# Patient Record
Sex: Male | Born: 1970 | Race: White | Hispanic: No | Smoking: Former smoker
Health system: Southern US, Community
[De-identification: ages and names within clinical notes are randomized; demographics above are authoritative.]

---

## 2013-08-11 ENCOUNTER — Emergency Department (HOSPITAL_COMMUNITY): Payer: Self-pay

## 2013-08-11 ENCOUNTER — Emergency Department (HOSPITAL_COMMUNITY)
Admission: EM | Admit: 2013-08-11 | Discharge: 2013-08-12 | Disposition: A | Discharge status: Home / Self Care | Payer: Self-pay | Attending: Emergency Medicine | Admitting: Emergency Medicine

## 2013-08-11 DIAGNOSIS — R05 Cough: Secondary | ICD-10-CM

## 2013-08-11 DIAGNOSIS — R059 Cough, unspecified: Secondary | ICD-10-CM | POA: Insufficient documentation

## 2013-08-11 DIAGNOSIS — J45901 Unspecified asthma with (acute) exacerbation: Secondary | ICD-10-CM | POA: Insufficient documentation

## 2013-08-11 LAB — CBC
HCT: 45.3 % (ref 39.0–52.0)
Hemoglobin: 15.8 g/dL (ref 13.0–17.0)
MCH: 31.9 pg (ref 26.0–34.0)
MCHC: 34.9 g/dL (ref 30.0–36.0)
MCV: 91.3 fL (ref 78.0–100.0)
Platelets: 205 10*3/uL (ref 150–400)
RBC: 4.96 MIL/uL (ref 4.22–5.81)
RDW: 12.9 % (ref 11.5–15.5)
WBC: 12.5 10*3/uL — ABNORMAL HIGH (ref 4.0–10.5)

## 2013-08-11 LAB — BASIC METABOLIC PANEL
Anion gap: 18 — ABNORMAL HIGH (ref 5–15)
BUN: 9 mg/dL (ref 6–23)
CALCIUM: 8.8 mg/dL (ref 8.4–10.5)
CO2: 22 mEq/L (ref 19–32)
Chloride: 99 mEq/L (ref 96–112)
Creatinine, Ser: 1.21 mg/dL (ref 0.50–1.35)
GFR, EST AFRICAN AMERICAN: 83 mL/min — AB (ref >90)
GFR, EST NON AFRICAN AMERICAN: 71 mL/min — AB (ref >90)
Glucose, Bld: 87 mg/dL (ref 70–99)
Potassium: 3.8 mEq/L (ref 3.7–5.3)
Sodium: 139 mEq/L (ref 137–147)

## 2013-08-11 MED ORDER — ALBUTEROL (5 MG/ML) CONTINUOUS INHALATION SOLN
10.0000 mg/h | INHALATION_SOLUTION | RESPIRATORY_TRACT | Status: DC
  Administered 2013-08-11: 10 mg/h via RESPIRATORY_TRACT
  Filled 2013-08-11: qty 20

## 2013-08-11 MED ORDER — IPRATROPIUM-ALBUTEROL 0.5-2.5 (3) MG/3ML IN SOLN
3.0000 mL | Freq: Once | RESPIRATORY_TRACT | Status: AC
  Administered 2013-08-11: 3 mL via RESPIRATORY_TRACT
  Filled 2013-08-11: qty 3

## 2013-08-11 NOTE — ED Notes (Signed)
Pt alert, NAD, calm, interactive, resps e/u, speaking in clear complete sentences, no dyspnea noted, [facility]

## 2013-08-11 NOTE — ED Notes (Signed)
Pt reports a worsening cough for three days. Pt has audible wheezing in triage, speaking in 3 word sentences, increase shortness of breath with movement, presents with a dry cough, denies fever and chills at home

## 2013-08-12 ENCOUNTER — Encounter (HOSPITAL_COMMUNITY): Payer: Self-pay | Admitting: Emergency Medicine

## 2013-08-12 MED ORDER — PREDNISONE 20 MG PO TABS
60.0000 mg | ORAL_TABLET | Freq: Once | ORAL | Status: AC
  Administered 2013-08-12: 60 mg via ORAL
  Filled 2013-08-12: qty 3

## 2013-08-12 MED ORDER — ALBUTEROL SULFATE HFA 108 (90 BASE) MCG/ACT IN AERS
6.0000 | INHALATION_SPRAY | Freq: Once | RESPIRATORY_TRACT | Status: AC
  Administered 2013-08-12: 6 via RESPIRATORY_TRACT
  Filled 2013-08-12: qty 6.7

## 2013-08-12 MED ORDER — PREDNISONE 10 MG PO TABS: 60.0000 mg | ORAL_TABLET | Freq: Every day | ORAL | Status: AC

## 2013-08-12 NOTE — ED Provider Notes (Signed)
CSN: [id number]     Arrival date & time 08/11/13  2134 History   First MD Initiated Contact with Patient 08/11/13 2337     Chief Complaint  Patient presents with  . Shortness of Breath     (Consider location/radiation/quality/duration/timing/severity/associated sxs/prior Treatment) Patient is a 43 y.o. male presenting with shortness of breath.  Shortness of Breath Severity:  Severe Onset quality:  Gradual Duration:  3 days Timing:  Constant Progression:  Worsening Chronicity:  New Context: URI   Relieved by:  Nothing Worsened by:  Exertion and coughing Ineffective treatments: cold medicine. Associated symptoms: chest pain ("it feels like I've been doing situps"), cough and sputum production   Associated symptoms: no abdominal pain, no fever and no vomiting     No past medical history on file. No past surgical history on file. No family history on file. History  Substance Use Topics  . Smoking status: Not on file  . Smokeless tobacco: Not on file  . Alcohol Use: Not on file    Review of Systems  Constitutional: Negative for fever.  Respiratory: Positive for cough, sputum production and shortness of breath.   Cardiovascular: Positive for chest pain ("it feels like I've been doing situps").  Gastrointestinal: Negative for nausea, vomiting, abdominal pain and diarrhea.  All other systems reviewed and are negative.     Allergies  Review of patient's allergies indicates not on file.  Home Medications   Prior to Admission medications   Not on File   BP 169/100  Pulse 79  Temp(Src) 98.3 F (36.8 C) (Oral)  Resp 15  Ht 6\' 2"  (1.88 m)  Wt 218 lb (98.884 kg)  BMI 27.98 kg/m2  SpO2 99% Physical Exam  Nursing note and vitals reviewed. Constitutional: He is oriented to person, place, and time. He appears well-developed and well-nourished. No distress.  HENT:  Frenkel: Normocephalic and atraumatic.  Mouth/Throat: Oropharynx is clear and moist.  Eyes: Conjunctivae  are normal. Pupils are equal, round, and reactive to light. No scleral icterus.  Neck: Neck supple.  Cardiovascular: Normal rate, regular rhythm, normal heart sounds and intact distal pulses.   No murmur heard. Pulmonary/Chest: Effort normal. No stridor. Not tachypneic. No respiratory distress. He has wheezes (mild expiratory). He has no rales.  Abdominal: Soft. He exhibits no distension. There is no tenderness.  Musculoskeletal: Normal range of motion. He exhibits no edema.  Neurological: He is alert and oriented to person, place, and time.  Skin: Skin is warm and dry. No rash noted.  Psychiatric: He has a normal mood and affect. His behavior is normal.    ED Course  Procedures (including critical care time) Labs Review Labs Reviewed  CBC - Abnormal; Notable for the following:    WBC 12.5 (*)    All other components within normal limits  BASIC METABOLIC PANEL - Abnormal; Notable for the following:    GFR calc non Af Amer 71 (*)    GFR calc Af Amer 83 (*)    Anion gap 18 (*)    All other components within normal limits    Imaging Review Dg Chest 2 View (if Patient Has Fever And/or Copd)  08/11/2013   CLINICAL DATA:  Shortness of breath  EXAM: CHEST  2 VIEW  COMPARISON:  None.  FINDINGS: The heart size and mediastinal contours are within normal limits. Both lungs are clear. The visualized skeletal structures are unremarkable.  IMPRESSION: No active cardiopulmonary disease.   Electronically Signed   By: Elige Ko  On: 08/11/2013 22:34  All radiology studies independently viewed by me.      EKG Interpretation None      MDM   Final diagnoses:  Cough  Reactive airway disease with acute exacerbation    43 yo male with 20 pack year history of smoking (quit a few years ago) who presents with SOB.  Wheezing and in mild respiratory distress per nursing report on arrival.  By time of my eval, was on second albuterol treatment and feeling much better.  Still with some wheezing.   CXR clear.  Will continue albuterol, give prednisone.  He states he had childhood asthma and has had some mild breathing difficulty occasionally, but nothing as severe as this.    He was observed during his ED course and ultimately improved significantly with albuterol and steroid treatments.  He ambulated without increased WOB or hypoxia.  He felt better.  He appeared stable for continued management outpatient, but was advised to return to the ED if his breathing worsened.  Candyce ChurnJohn Ajene Tanyon Alipio III, MD 08/13/13 [phone]

## 2013-08-12 NOTE — Discharge Instructions (Signed)
Use your inhaler, 2-4 puffs, every 4 hours for the next 36 hours, then use as needed.   Emergency Department Resource Guide 1) Find a Doctor and Pay Out of Pocket Although you won't have to find out who is covered by your insurance plan, it is a good idea to ask around and get recommendations. You will then need to call the office and see if the doctor you have chosen will accept you as a new patient and what types of options they offer for patients who are self-pay. Some doctors offer discounts or will set up payment plans for their patients who do not have insurance, but you will need to ask so you aren't surprised when you get to your appointment.  2) Contact Your Local Health Department Not all health departments have doctors that can see patients for sick visits, but many do, so it is worth a call to see if yours does. If you don't know where your local health department is, you can check in your phone book. The CDC also has a tool to help you locate your state's health department, and many state websites also have listings of all of their local health departments.  3) Find a Walk-in Clinic If your illness is not likely to be very severe or complicated, you may want to try a walk in clinic. These are popping up all over the country in pharmacies, drugstores, and shopping centers. They're usually staffed by nurse practitioners or physician assistants that have been trained to treat common illnesses and complaints. They're usually fairly quick and inexpensive. However, if you have serious medical issues or chronic medical problems, these are probably not your best option.  No Primary Care Doctor: - Call Health Connect at  [phone] - they can help you locate a primary care doctor that  accepts your insurance, provides certain services, etc. - Physician Referral Service- [phone]  Chronic Pain Problems: Organization         Address  Phone   Notes  [facility]  [phone]  [phone] Patients need to be referred by their primary care doctor.   Medication Assistance: Organization         Address  Phone   Notes  [facility] Medication [facility] [address] [place], [place] [address] [phone] --Must be a resident of [place] -- Must have NO insurance coverage whatsoever (no Medicaid/ Medicare, etc.) -- The pt. MUST have a primary care doctor that directs their care regularly and follows them in the community   MedAssist  [phone]   [employer]  [phone]    Agencies that provide inexpensive medical care: Organization         Address  Phone   Notes  [facility]  [phone]   [facility]    [phone]) [phone]   [facility] [address] [place], [place] [address] [phone]   [facility] [address], [place] [phone]   [facility]    [phone]   [facility]    [phone]   [facility] and [facility]  [address], [place] Phone:  [phone], Fax:  [phone] Hours of Operation:  9 am - 6 pm, M-F.  Also accepts Medicaid/Medicare and self-pay.  [facility]  [address], [place] Phone: [phone], Fax: [phone]) [phone]. Hours of  Operation:  8:30 am - 5:30 pm, M-F.  Also accepts Medicaid and self-pay.  [facility] [place] [address], [place] Phone: [phone]   [place], [place], [place] [phone], Ext. [phone] Mondays & Thursdays: 7-9 AM.  First 15 patients are seen on a first come, first serve basis.    [place] Providers:  Organization         Address  Phone   Notes  [facility] [address], [place] [phone] Also accepts self-pay patients.  [facility] [address], [place]   [phone]   [place], [address], [place] [phone]   [facility] [address], [place] [phone]   Lucianne Lei [address], [place]   [phone] Only accepts [place] Access [place] patients after they have their name applied to their card.   Self-Pay (no insurance) in [facility]:  Organization         Address  Phone   Notes  Sickle Cell Patients, [facility] Start [phone]   [facility] [place] [phone]   [facility] [place]  [place], [place], [place] [phone]   [facility]/Dr. Osei-Bonsu  [address] [place] or [address], [place] [phone] Phone number for both [place] and [place] locations is the same.  Urgent Medical and [facility] [address] [place] [phone]   [facility] [address] [place] or [address] [phone]   [facility] [address], [place] [phone], phone; [phone], fax Sees patients 1st and 3rd Saturday of every month.  Must not qualify for public or private insurance (i.e. Medicaid, Medicare, [place] Health Choice, Veterans' Benefits)  Household income should be no more than 200% of the poverty level The clinic cannot treat you if you are pregnant or think you are pregnant  Sexually transmitted diseases are not treated at the clinic.    Dental Care: Organization         Address  Phone  Notes  [facility] of [facility] [place] [phone] Accepts children up to age 67 who are enrolled in [place] or [place]; pregnant women with a Medicaid card; and children who have applied for Medicaid or [place] Health Choice, but  were declined, whose parents can pay a reduced fee at time of service.  [facility]  [address], [place] [phone] Accepts children up to age 45 who are enrolled in [place] or [place]; pregnant women with a Medicaid card; and children who have applied for Medicaid or [place] Health Choice, but were declined, whose parents can pay a reduced fee at time of service.  [place] Adult Dental Access PROGRAM  [place] [phone] Patients are seen by appointment only. Walk-ins are not accepted. [place] will see patients 9 years of age and older. Monday - Tuesday (8am-5pm) Most Wednesdays (8:30-5pm) $30 per visit, cash only  [facility] Adult Dental Access PROGRAM  [address], [facility] [phone] Patients are seen  by appointment only. Walk-ins are not accepted. [place] will see patients 2 years of age and older. One Wednesday Evening (Monthly: Volunteer Based).  $30 per visit, cash only  [place]  [phone] for adults; Children under age 54, call Graduate Pediatric Dentistry at [phone]. Children aged 40-14, please call [phone] to request a pediatric application.  Dental services are provided in all areas of dental care including fillings, crowns and bridges, complete and partial dentures, implants, gum treatment, root canals, and extractions. Preventive care is also provided. Treatment is provided to both adults and children. Patients are selected via a lottery and there is often a waiting list.   [facility] [address], [place]  [phone] [email or web address]   [facility] [address] [place], [place] [phone], Ext. [phone] Second and Fourth Thursday of each month, opens at 6:30 AM; Clinic ends at 9 AM.  Patients are seen on a first-come first-served basis, and a limited number are seen during each clinic.    [facility]  [address] Hillard Danker [place], [place] [phone]   Eligibility Requirements You must have lived in [place], [place], or [place] counties for at least the last three months.   You cannot be eligible for state or federal sponsored [employer], including [employer], [place], or [employer].   You generally cannot be eligible for healthcare insurance through your employer.    How to apply: Eligibility screenings are held every Tuesday and Wednesday afternoon from 1:00 pm until 4:00 pm. You do not need an appointment for the interview!  [facility] [address] [place], [place]   [place]  [place] Department  [place]  [phone]    Behavioral Health Resources in the Community: Intensive Outpatient Programs Organization         Address  Phone  Notes  [place]. [address] [place], [place] [phone]   [facility] Outpatient [address], [place], [place]   ADS: Alcohol & Drug Svcs [address], [place], [place]   [place] [address]  [place], [place] or [phone]   Substance Abuse Resources Organization         Address  Phone  Notes  Alcohol and Drug Services  [phone]   [place]  [phone]   [facility] [place]   Chinita Pester  [phone]   Residential & Outpatient Substance Abuse Program  [phone]   Psychological Services Organization         Address  Phone  Notes  [facility] [place]  [place]  [phone]   [place] [address], [place] or [phone]    Mobile Crisis Teams Organization         Address  Phone  Notes  [facility], Mobile Crisis Care  Unit  [phone]   [facility] Psychotherapeutic Services  [address] [place], [place]   [place] Levels [address] [place] [phone]    Self-Help/Support Groups Organization         Address  Phone             Notes  [place]. of Time - variety of support groups  [place] Call for more  information  Narcotics Anonymous (NA), [facility]aring [facility] [address], [place]  2 meetings at this location   Residential Facilities manager         Address  Phone  Notes  ASAP Residential Treatment [place],    Page  [phone]   [facility]  [address], [place] [phone], [place], [place]   [place], [place] [phone] Admissions: 8am-3pm M-F  Incentives Substance [place] [address],    [place], [place] [phone]   [facility] [address] [place], [place], [place]   [facility] [address]  [place], [place]   [facility] Programs - Intensive Outpatient [address], [place], [place]   [facility] ([place].) [place].,  [place], [place] [phone] or [phone]   Residential Treatment Services (RTS) [address], [place], [place] Accepts Medicaid  [facility] [place] [address],  [place] [phone] Substance Abuse/Addiction Treatment   [facility] Organization         Address  Phone  Notes  [facility]  [phone]   Domenic Schwab, PhD [address] Arlis Porta [place], [place]   [phone] or [phone]   [place], [place] [phone]   [facility] [address] [place], [place] [phone] Insurance/Medicaid/sponsorship through [facility] and Families [address]                                     [place], [place] [phone] [place] [address], [place] [phone]    Dr. Adele Schilder  [phone]   [facility] Dept. 1) [address] [place] 2) [place] 3)  [place] [address], [place] [phone]  [phone]   [place] [phone] or [phone] (After Hours)

## 2015-01-09 IMAGING — CR DG CHEST 2V
2 series · 2 of 2 positions shown · non-contrast
Comparison: None.

CLINICAL DATA: Shortness of breath

EXAM:
CHEST  2 VIEW

[w chest pa]
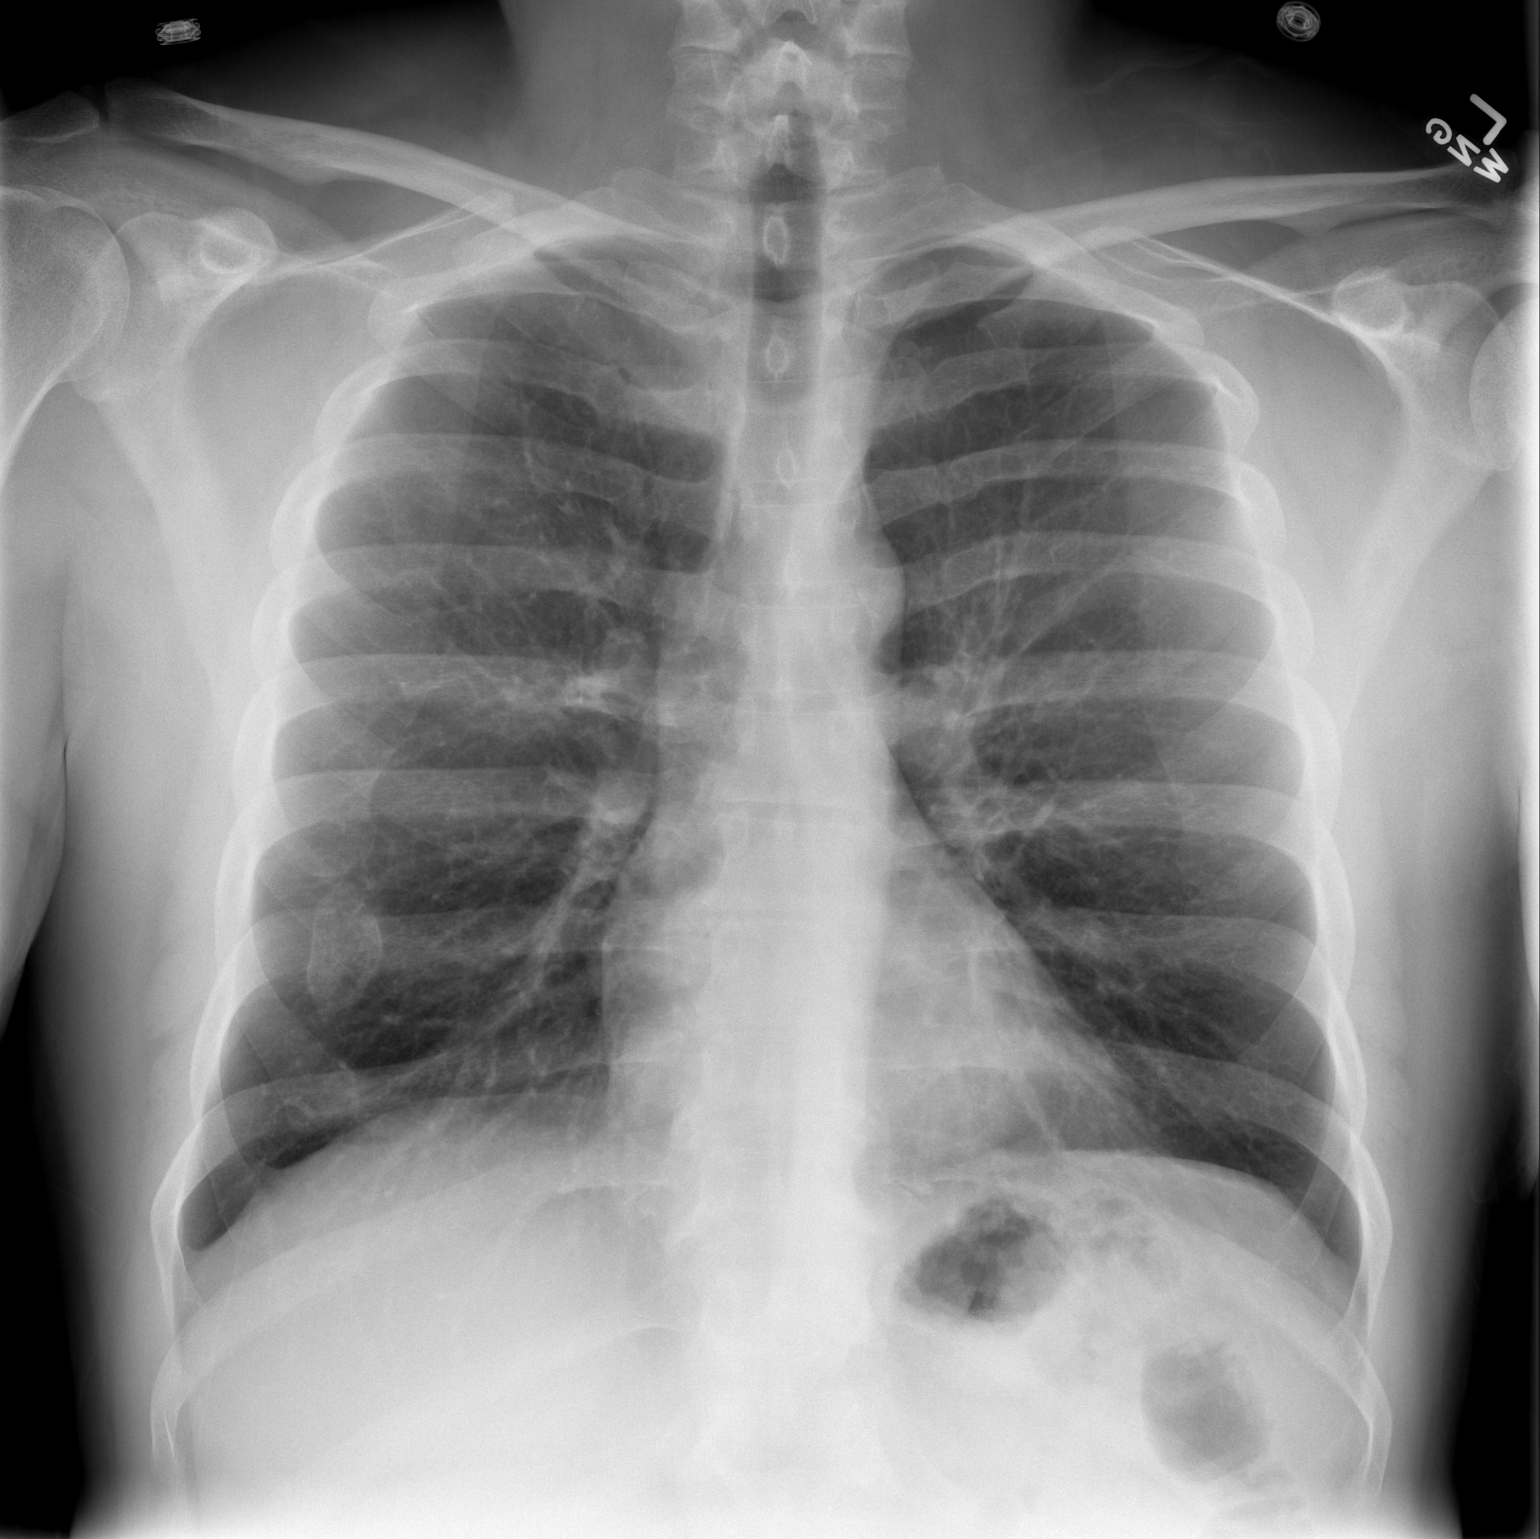

[w chest lat]
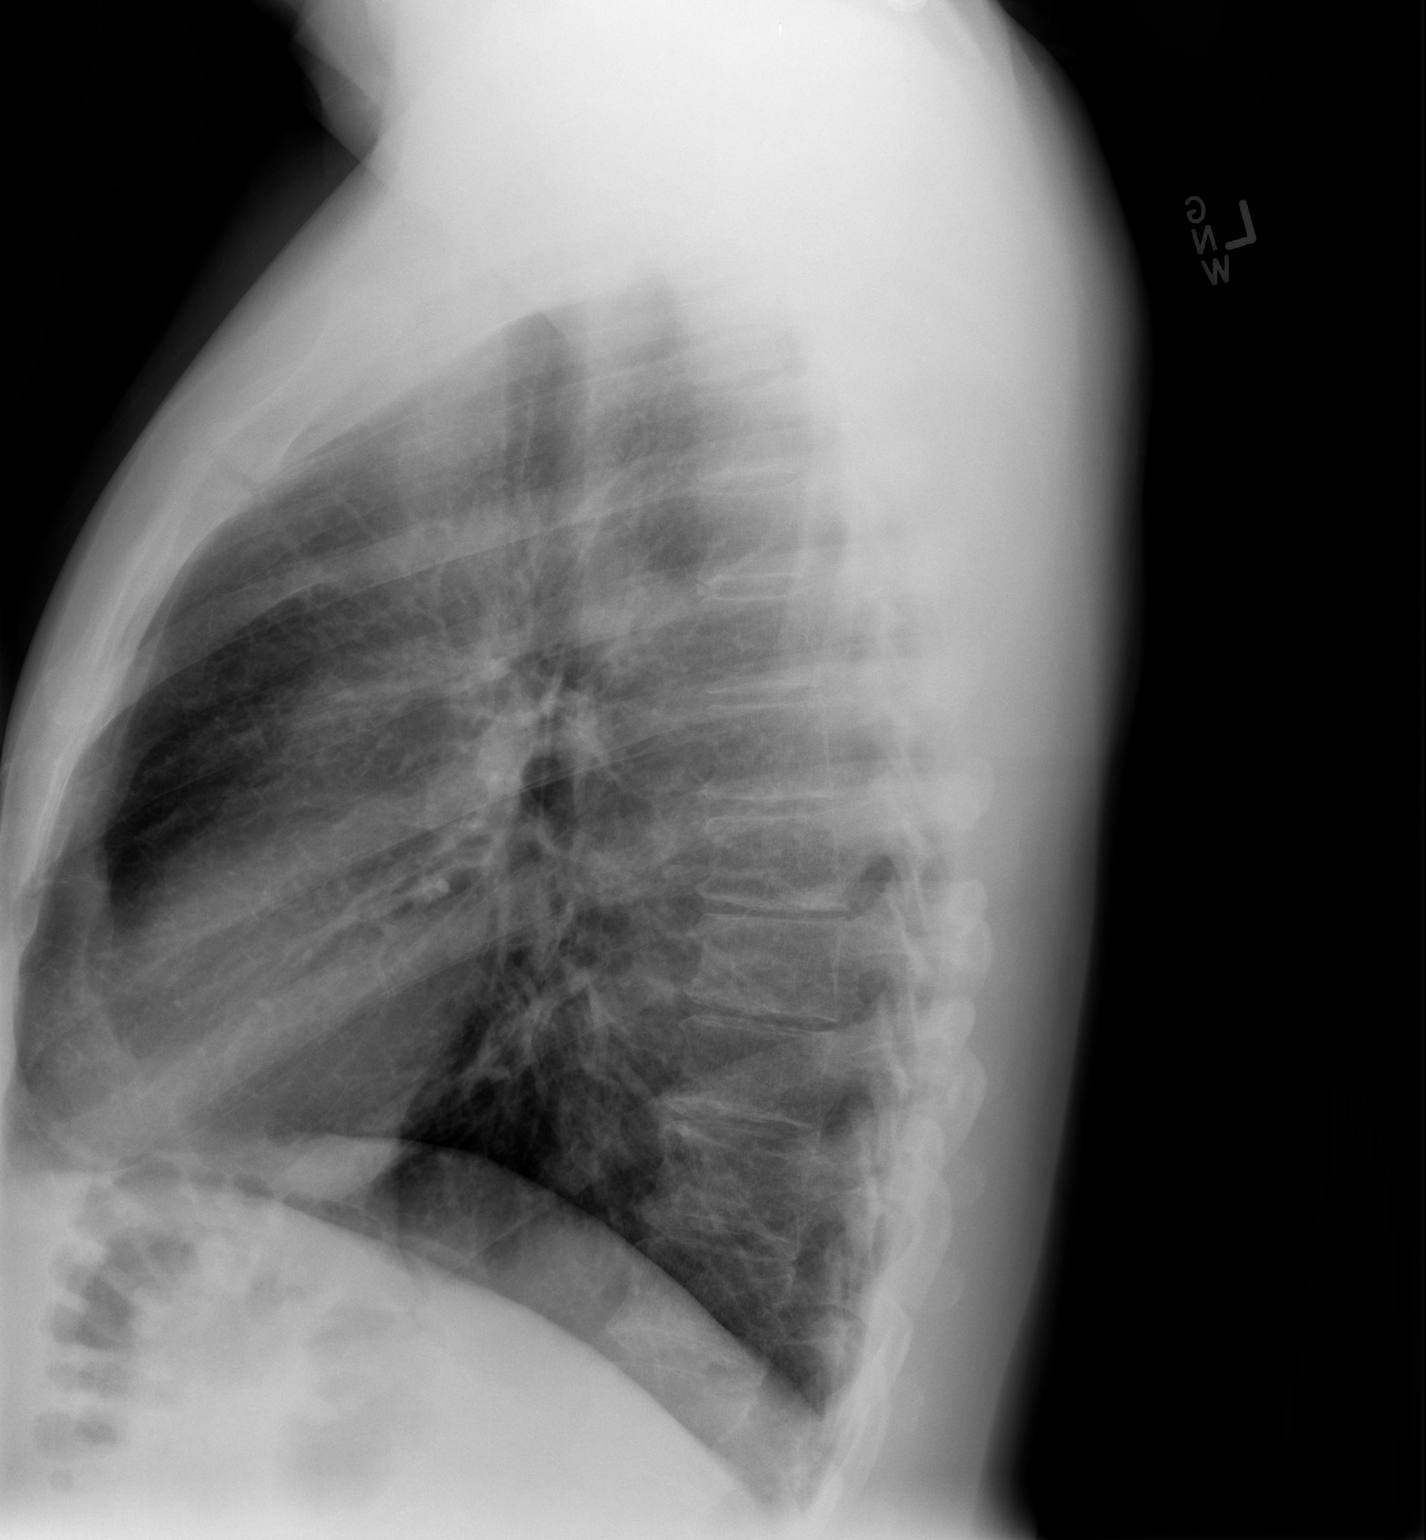

[2 of 2 positions shown; findings below may reference images not displayed]

FINDINGS: The heart size and mediastinal contours are within normal limits.
Both lungs are clear. The visualized skeletal structures are
unremarkable.
IMPRESSION: No active cardiopulmonary disease.
